# Patient Record
Sex: Male | Born: 1960
Health system: Southern US, Community
[De-identification: ages and names within clinical notes are randomized; demographics above are authoritative.]

## PROBLEM LIST (undated history)

## (undated) ENCOUNTER — Emergency Department (HOSPITAL_COMMUNITY): Discharge status: Absent without leave | Payer: 59

## (undated) DIAGNOSIS — L309 Dermatitis, unspecified: Secondary | ICD-10-CM

## (undated) HISTORY — DX: Dermatitis, unspecified: L30.9

---

## 2007-07-21 ENCOUNTER — Encounter: Admission: RE | Admit: 2007-07-21 | Discharge: 2007-07-21 | Payer: Self-pay | Admitting: Internal Medicine

## 2009-04-17 ENCOUNTER — Encounter: Admission: RE | Admit: 2009-04-17 | Discharge: 2009-04-17 | Payer: Self-pay | Admitting: Occupational Medicine

## 2009-10-10 ENCOUNTER — Emergency Department (HOSPITAL_COMMUNITY): Admission: EM | Admit: 2009-10-10 | Discharge: 2009-10-10 | Payer: Self-pay | Admitting: Emergency Medicine

## 2011-01-10 ENCOUNTER — Encounter: Payer: Self-pay | Admitting: Internal Medicine

## 2011-08-06 ENCOUNTER — Emergency Department (HOSPITAL_COMMUNITY)
Admission: EM | Admit: 2011-08-06 | Discharge: 2011-08-06 | Disposition: A | Discharge status: Home / Self Care | Payer: Worker's Compensation | Attending: Emergency Medicine | Admitting: Emergency Medicine

## 2011-08-06 DIAGNOSIS — W2209XA Striking against other stationary object, initial encounter: Secondary | ICD-10-CM | POA: Insufficient documentation

## 2011-08-06 DIAGNOSIS — Y990 Civilian activity done for income or pay: Secondary | ICD-10-CM | POA: Insufficient documentation

## 2011-08-06 DIAGNOSIS — S61209A Unspecified open wound of unspecified finger without damage to nail, initial encounter: Secondary | ICD-10-CM | POA: Insufficient documentation

## 2012-10-09 ENCOUNTER — Encounter (HOSPITAL_COMMUNITY): Payer: Self-pay

## 2012-10-09 ENCOUNTER — Emergency Department (INDEPENDENT_AMBULATORY_CARE_PROVIDER_SITE_OTHER): Payer: 59

## 2012-10-09 ENCOUNTER — Emergency Department (HOSPITAL_COMMUNITY)
Admission: EM | Admit: 2012-10-09 | Discharge: 2012-10-09 | Disposition: A | Discharge status: Home / Self Care | Payer: 59 | Source: Home / Self Care | Attending: Emergency Medicine | Admitting: Emergency Medicine

## 2012-10-09 DIAGNOSIS — Z23 Encounter for immunization: Secondary | ICD-10-CM

## 2012-10-09 DIAGNOSIS — IMO0002 Reserved for concepts with insufficient information to code with codable children: Secondary | ICD-10-CM

## 2012-10-09 DIAGNOSIS — S92919A Unspecified fracture of unspecified toe(s), initial encounter for closed fracture: Secondary | ICD-10-CM

## 2012-10-09 DIAGNOSIS — S91209A Unspecified open wound of unspecified toe(s) with damage to nail, initial encounter: Secondary | ICD-10-CM

## 2012-10-09 MED ORDER — TETANUS-DIPHTH-ACELL PERTUSSIS 5-2.5-18.5 LF-MCG/0.5 IM SUSP
INTRAMUSCULAR | Status: AC
  Filled 2012-10-09: qty 0.5

## 2012-10-09 MED ORDER — CEPHALEXIN 500 MG PO CAPS: 500.0000 mg | ORAL_CAPSULE | Freq: Three times a day (TID) | ORAL | Status: DC

## 2012-10-09 MED ORDER — TETANUS-DIPHTH-ACELL PERTUSSIS 5-2.5-18.5 LF-MCG/0.5 IM SUSP
0.5000 mL | Freq: Once | INTRAMUSCULAR | Status: AC
  Administered 2012-10-09: 0.5 mL via INTRAMUSCULAR

## 2012-10-09 MED ORDER — LIDOCAINE HCL (PF) 1 % IJ SOLN
INTRAMUSCULAR | Status: AC
  Filled 2012-10-09: qty 5

## 2012-10-09 MED ORDER — TRAMADOL HCL 50 MG PO TABS: 100.0000 mg | ORAL_TABLET | Freq: Three times a day (TID) | ORAL | Status: DC | PRN

## 2012-10-09 MED ORDER — CEFTRIAXONE SODIUM 1 G IJ SOLR
1.0000 g | Freq: Once | INTRAMUSCULAR | Status: AC
  Administered 2012-10-09: 1 g via INTRAMUSCULAR

## 2012-10-09 MED ORDER — CEFTRIAXONE SODIUM 1 G IJ SOLR
INTRAMUSCULAR | Status: AC
  Filled 2012-10-09: qty 10

## 2012-10-09 NOTE — ED Notes (Signed)
Reportedly injured his right great toe 1 week ago while splitting wood; concern for toenail loss and poss infection

## 2012-10-09 NOTE — ED Provider Notes (Signed)
Chief Complaint  Patient presents with  . Toe Injury    History of Present Illness:   Steven Burton is a 51 year old male who injured his right great toe about 2-3 weeks ago. He was splitting some logs and one of them fell on his toe. He had some blood underneath the nail. His wife is an Char[employer] fundraiser and heated paperclip and made a couple small holes in the toenail to release the blood blood. Ever since then he's had increasing pain and swelling over the great toe with some erythema appearing in the last couple days. There's been no purulent drainage.  Review of Systems:  Other than noted above, the patient denies any of the following symptoms: Systemic:  No fevers, chills, sweats, or aches.  No fatigue or tiredness. Musculoskeletal:  No joint pain, arthritis, bursitis, swelling, back pain, or neck pain. Neurological:  No muscular weakness, paresthesias, headache, or trouble with speech or coordination.  No dizziness.  PMFSH:  Past medical history, family history, social history, meds, and allergies were reviewed.  Physical Exam:   Vital signs:  BP 126/81  Pulse 77  Temp 98.4 F (36.9 C) (Oral)  Resp 12  SpO2 97% Gen:  Alert and oriented times 3.  In no distress. Musculoskeletal: The toenail was lifted off of the nail plate and was ballotable. There appears to be fluid or pus underneath the nail. There is erythema of the nail fold surrounding the entire nail and tenderness to palpation. Otherwise, all joints had a full a ROM with no swelling, bruising or deformity.  No edema, pulses full. Extremities were warm and pink.  Capillary refill was brisk.  Skin:  Clear, warm and dry.  No rash. Neuro:  Alert and oriented times 3.  Muscle strength was normal.  Sensation was intact to light touch.     Radiology:  Dg Toe Great Right  10/09/2012  *RADIOLOGY REPORT*  Clinical Data: Great toe penetrating injury by a piece of wood, now red and swollen question infected  RIGHT GREAT TOE  Comparison: None  Findings:  Mild soft tissue swelling right great toe. Longitudinal lucency in the distal phalanx of the great toe extending to the IP joint question nondisplaced fracture. No additional fracture or dislocation. No definite radiopaque foreign body identified. Questionable subungual lucency versus artifact at the great toe.  IMPRESSION: Probable nondisplaced longitudinal fracture at distal phalanx right great toe extending into IP joint. No definite radiopaque foreign body identified. Questionable subungual lucency at the great toe could represent artifact though gas/air and radiolucent foreign body not excluded with this appearance.   Original Report Authenticated By: Lollie Marrow, M.D.      I reviewed the images independently and personally and concur with the radiologist's findings.  Procedure Note:  Verbal informed consent was obtained from the patient.  Risks and benefits were outlined with the patient.  Patient understands and accepts these risks.  Identity of the patient was confirmed verbally and by armband.    Procedure was performed as followed:  The toenail was prepped with alcohol and a digital block was performed with 5 mL of 2% Xylocaine without epinephrine. Thereafter, satisfactory anesthesia was achieved. The nail itself was prepped with Betadine. The nail is very loose and was easily lifted off of the nail bed with a curved hemostat there was minimal bleeding. Antibiotic ointment was applied and a nonstick dressing. The patient was placed in a postoperative boot.  Patient tolerated the procedure well without any immediate complications.  Course in  Urgent Care Center:   He was given Rocephin 1 g IM and tolerated this well without any immediate side effects.  Assessment:  The primary encounter diagnosis was Closed fracture of distal phalanx of toe. Diagnoses of Paronychia and Nail avulsion, toe were also pertinent to this visit.  Plan:   1.  The following meds were prescribed:   New  Prescriptions   CEPHALEXIN (KEFLEX) 500 MG CAPSULE    Take 1 capsule (500 mg total) by mouth 3 (three) times daily.   TRAMADOL (ULTRAM) 50 MG TABLET    Take 2 tablets (100 mg total) by mouth every 8 (eight) hours as needed for pain.   2.  The patient was instructed in symptomatic care, including rest and activity, elevation, application of ice and compression.  Appropriate handouts were given. 3.  The patient was told to return if becoming worse in any way, if no better in 3 or 4 days, and given some red flag symptoms that would indicate earlier return.   4.  The patient was told to follow up with Dr. Ranell Patrick in 3 or 4 days.   [name], MD 10/09/12 1455

## 2012-10-09 NOTE — ED Notes (Signed)
Fitted w approp sized shoe, instructed in use . Advised to call orhto ASAP for f/u exam. Cautioned regarding medication caompliance and safety

## 2012-10-12 LAB — CULTURE, ROUTINE-ABSCESS

## 2012-10-12 NOTE — ED Notes (Signed)
Abscess culture R toe nail:  Abundant Staph. Aureus. Pt. treated with Keflex.  Lab shown to Dr. Lorenz Coaster and he said it was OK- sensitive to Oxacillin. Vassie Moselle 10/12/2012

## 2013-04-02 ENCOUNTER — Emergency Department (HOSPITAL_COMMUNITY)
Admission: EM | Admit: 2013-04-02 | Discharge: 2013-04-03 | Disposition: A | Discharge status: Home / Self Care | Payer: 59 | Attending: Emergency Medicine | Admitting: Emergency Medicine

## 2013-04-02 ENCOUNTER — Encounter (HOSPITAL_COMMUNITY): Payer: Self-pay | Admitting: *Deleted

## 2013-04-02 ENCOUNTER — Encounter (HOSPITAL_COMMUNITY)
Admission: EM | Disposition: A | Discharge status: Home / Self Care | Payer: Self-pay | Source: Home / Self Care | Attending: Emergency Medicine

## 2013-04-02 DIAGNOSIS — K2981 Duodenitis with bleeding: Secondary | ICD-10-CM | POA: Insufficient documentation

## 2013-04-02 DIAGNOSIS — K222 Esophageal obstruction: Secondary | ICD-10-CM

## 2013-04-02 DIAGNOSIS — K2980 Duodenitis without bleeding: Secondary | ICD-10-CM

## 2013-04-02 DIAGNOSIS — K449 Diaphragmatic hernia without obstruction or gangrene: Secondary | ICD-10-CM | POA: Insufficient documentation

## 2013-04-02 DIAGNOSIS — T18108A Unspecified foreign body in esophagus causing other injury, initial encounter: Secondary | ICD-10-CM | POA: Insufficient documentation

## 2013-04-02 DIAGNOSIS — IMO0002 Reserved for concepts with insufficient information to code with codable children: Secondary | ICD-10-CM | POA: Insufficient documentation

## 2013-04-02 DIAGNOSIS — K208 Other esophagitis without bleeding: Secondary | ICD-10-CM

## 2013-04-02 HISTORY — PX: ESOPHAGOGASTRODUODENOSCOPY: SHX5428

## 2013-04-02 SURGERY — EGD (ESOPHAGOGASTRODUODENOSCOPY)
Anesthesia: Moderate Sedation

## 2013-04-02 MED ORDER — DIPHENHYDRAMINE HCL 50 MG/ML IJ SOLN
INTRAMUSCULAR | Status: AC
  Filled 2013-04-02: qty 1

## 2013-04-02 MED ORDER — FENTANYL CITRATE 0.05 MG/ML IJ SOLN
INTRAMUSCULAR | Status: AC
  Filled 2013-04-02: qty 4

## 2013-04-02 MED ORDER — SODIUM CHLORIDE 0.9 % IV SOLN: INTRAVENOUS | Status: DC

## 2013-04-02 MED ORDER — GLUCAGON HCL (RDNA) 1 MG IJ SOLR
1.0000 mg | Freq: Once | INTRAMUSCULAR | Status: AC
  Administered 2013-04-02: 1 mg via INTRAVENOUS
  Filled 2013-04-02: qty 1

## 2013-04-02 MED ORDER — MIDAZOLAM HCL 10 MG/2ML IJ SOLN
INTRAMUSCULAR | Status: AC
  Filled 2013-04-02: qty 2

## 2013-04-02 NOTE — ED Provider Notes (Signed)
History     CSN: [id number]  Arrival date & time 04/02/13  2123   First MD Initiated Contact with Patient 04/02/13 2147      Chief Complaint  Patient presents with  . Foreign Body    HPI Pt was seen at 2155.   Per pt and his spouse, c/o gradual onset and persistence of constant esophageal FB that began approx 3 hours PTA.  Pt states he was eating a slice of apple when he "choked" while swallowing it and "now it's stuck in my throat."  States he cannot swallow water without regurgitating it.  States he has previous hx of same symptoms, but "I can usually get the food to go down eventually."  Pt has not been eval by a GI MD for these symptoms.  Denies wheezing, no stridor, no CP/SOB, no abd pain.     History reviewed. No pertinent past medical history.  History reviewed. No pertinent past surgical history.    History  Substance Use Topics  . Smoking status: Never Smoker   . Smokeless tobacco: Not on file  . Alcohol Use: Yes     Comment: rare      Review of Systems ROS: Statement: All systems negative except as marked or noted in the HPI; Constitutional: Negative for fever and chills. ; ; Eyes: Negative for eye pain, redness and discharge. ; ; ENMT: Negative for ear pain, hoarseness, nasal congestion, sinus pressure and sore throat. ; ; Cardiovascular: Negative for chest pain, palpitations, diaphoresis, dyspnea and peripheral edema. ; ; Respiratory: Negative for cough, wheezing and stridor. ; ; Gastrointestinal: +esophageal FB. Negative for nausea, vomiting, diarrhea, abdominal pain, blood in stool, hematemesis, jaundice and rectal bleeding. . ; ; Genitourinary: Negative for dysuria, flank pain and hematuria. ; ; Musculoskeletal: Negative for back pain and neck pain. Negative for swelling and trauma.; ; Skin: Negative for pruritus, rash, abrasions, blisters, bruising and skin lesion.; ; Neuro: Negative for headache, lightheadedness and neck stiffness. Negative for weakness, altered  level of consciousness , altered mental status, extremity weakness, paresthesias, involuntary movement, seizure and syncope.       Allergies  Review of patient's allergies indicates no known allergies.  Home Medications   Current Outpatient Rx  Name  Route  Sig  Dispense  Refill  . cetirizine (ZYRTEC) 10 MG tablet   Oral   Take 10 mg by mouth daily.         Marland Kitchen ibuprofen (ADVIL,MOTRIN) 200 MG tablet   Oral   Take 600 mg by mouth every 6 (six) hours as needed for pain.           BP 128/73  Pulse 89  Temp(Src) 98.8 F (37.1 C)  Resp 20  SpO2 100%  Physical Exam 2200: Physical examination:  Nursing notes reviewed; Vital signs and O2 SAT reviewed;  Constitutional: Well developed, Well nourished, Well hydrated, In no acute distress; Head:  Normocephalic, atraumatic; Eyes: EOMI, PERRL, No scleral icterus; ENMT: Mouth and pharynx normal, Mucous membranes moist; Neck: Supple, Full range of motion, No lymphadenopathy; Cardiovascular: Regular rate and rhythm, No murmur, rub, or gallop; Respiratory: Breath sounds clear & equal bilaterally, No stridor or wheezing.  Speaking full sentences with ease, Normal respiratory effort/excursion; Chest: Nontender, Movement normal; Abdomen: Soft, Nontender, Nondistended, Normal bowel sounds;; Extremities: Pulses normal, No tenderness, No edema, No calf edema or asymmetry.; Neuro: AA&Ox3, Major CN grossly intact.  Speech clear. Climbs on and off chair by himself without distress. Gait steady. No gross focal  motor or sensory deficits in extremities.; Skin: Color normal, Warm, Dry.   ED Course  Procedures     MDM  MDM Reviewed: previous chart, nursing note and vitals     2215:  Pt spitting oral secretions into trash can next to him.  No wheezing, stridor, or SOB.  Resps without distress, Sats 100% R/A.  T/C to GI Dr. Arlyce Dice, case discussed, including:  HPI, pertinent PM/SHx, VS/PE, dx testing, ED course and treatment:  Agreeable to come to ED for  eval for EGD, requests to give a dose of IV glucacon now.         Laray Anger, DO 04/03/13 [phone]

## 2013-04-02 NOTE — ED Notes (Signed)
Pt states has had piece of apple stuck in throat x 3 hours; spitting; speaking in phrases; no respiratory stridor; family at bedside

## 2013-04-02 NOTE — ED Notes (Signed)
Pt able to talk in full sentences but reports of being unable to swallow.  Pt continually spitting into a cup.

## 2013-04-02 NOTE — H&P (Signed)
  History of Present Illness:  52yo WM with h/o intermittent dysphagia to solids presents to ER with inability to swallow following ingestion of an apple.  Has had intermittent impactions in the past which resolved spontaneously.  Denies chest pain, pyrosis.    History reviewed. No pertinent past medical history. History reviewed. No pertinent past surgical history. family history is not on file. No current facility-administered medications for this encounter.   Allergies as of 04/02/2013  . (No Known Allergies)    reports that he has never smoked. He does not have any smokeless tobacco history on file. He reports that  drinks alcohol. He reports that he does not use illicit drugs.     Review of Systems: Pertinent positive and negative review of systems were noted in the above HPI section. All other review of systems were otherwise negative.  Vital signs were reviewed in today's medical record Physical Exam: General: Well developed , well nourished, no acute distress Skin: anicteric Head: Normocephalic and atraumatic Eyes:  sclerae anicteric, EOMI Ears: Normal auditory acuity Mouth: No deformity or lesions Neck: Supple, no masses or thyromegaly Lungs: Clear throughout to auscultation Heart: Regular rate and rhythm; no murmurs, rubs or bruits Abdomen: Soft, non tender and non distended. No masses, hepatosplenomegaly or hernias noted. Normal Bowel sounds Rectal:deferred Musculoskeletal: Symmetrical with no gross deformities  Skin: No lesions on visible extremities Pulses:  Normal pulses noted Extremities: No clubbing, cyanosis, edema or deformities noted Neurological: Alert oriented x 4, grossly nonfocal Cervical Nodes:  No significant cervical adenopathy Inguinal Nodes: No significant inguinal adenopathy Psychological:  Alert and cooperative. Normal mood and affect  Impression - Solid food impaction, likely secondary to esophageal stricture  Plan - EGD  Risks,  alternatives, and complications of the procedure, including bleeding, perforation, and possible need for surgery, were explained to the patient.  Patient's questions were answered.

## 2013-04-03 ENCOUNTER — Other Ambulatory Visit: Payer: Self-pay | Admitting: Gastroenterology

## 2013-04-03 ENCOUNTER — Encounter (HOSPITAL_COMMUNITY): Payer: Self-pay | Admitting: Gastroenterology

## 2013-04-03 DIAGNOSIS — K2980 Duodenitis without bleeding: Secondary | ICD-10-CM

## 2013-04-03 DIAGNOSIS — K222 Esophageal obstruction: Secondary | ICD-10-CM

## 2013-04-03 DIAGNOSIS — K208 Other esophagitis without bleeding: Secondary | ICD-10-CM

## 2013-04-03 DIAGNOSIS — T18108A Unspecified foreign body in esophagus causing other injury, initial encounter: Secondary | ICD-10-CM

## 2013-04-03 MED ORDER — BUTAMBEN-TETRACAINE-BENZOCAINE 2-2-14 % EX AERO
INHALATION_SPRAY | CUTANEOUS | Status: DC | PRN
  Administered 2013-04-02: 2 via TOPICAL

## 2013-04-03 MED ORDER — FENTANYL CITRATE 0.05 MG/ML IJ SOLN
INTRAMUSCULAR | Status: DC | PRN
  Administered 2013-04-02 – 2013-04-03 (×3): 25 ug via INTRAVENOUS

## 2013-04-03 MED ORDER — OMEPRAZOLE 20 MG PO CPDR: 20.0000 mg | DELAYED_RELEASE_CAPSULE | Freq: Every day | ORAL | Status: AC

## 2013-04-03 MED ORDER — MIDAZOLAM HCL 10 MG/2ML IJ SOLN
INTRAMUSCULAR | Status: DC | PRN
  Administered 2013-04-02 – 2013-04-03 (×2): 2.5 mg via INTRAVENOUS
  Administered 2013-04-03: 2 mg via INTRAVENOUS
  Administered 2013-04-03: 1 mg via INTRAVENOUS

## 2013-04-03 NOTE — Op Note (Addendum)
[facility] ,   ENDOSCOPY PROCEDURE REPORT  PATIENT: Steven Burton, Steven Burton  MR#: [id number] BIRTHDATE: 05/05/1961 , 52  yrs. old GENDER: Male ENDOSCOPIST: Louis Meckel, MD REFERRED BY:  Kirby Funk, M.D. PROCEDURE DATE:  04/02/2013 PROCEDURE:  EGD w/ fb removal and EGD w/ biopsy ASA CLASS:     Class II INDICATIONS:  esophageal foreign body. MEDICATIONS: These medications were titrated to patient response per physician's verbal order, Versed 8 mg IV, and Fentanyl 75 mcg IV TOPICAL ANESTHETIC: Cetacaine Spray  DESCRIPTION OF PROCEDURE: After the risks benefits and alternatives of the procedure were thoroughly explained, informed consent was obtained.  The    endoscope was introduced through the mouth and advanced to the third portion of the duodenum. Without limitations. The instrument was slowly withdrawn as the mucosa was fully examined.        Foreign body was in the mid esophagus.  It was easily pushed through into the stomach. There was a stricture in the distal esophagus.  The 7mm scope easily traversed the stricture.  A 3cm sliding hiatal hernia was present. There were concentric rings in the mid and proximal esophagus. Biopsies were taken.  There was a stricture in the distal esophagus.  The 7mm scope easily traversed the stricture.  A 3cm sliding hiatal hernia was present.  There were concentric rings in the mid and proximal esophagus.  Biopsies were taken.  DUODENUM: Moderate duodenitis with bleeding was noted in the duodenal bulb. Biopsies were taken.the remainder of the esophagus was otherwise normal. Retroflexed views revealed no abnormalities. The scope was then withdrawn from the patient and the procedure completed.  COMPLICATIONS: There were no complications. ENDOSCOPIC IMPRESSION: 1.  Esophageal foreign body 2.  esophageal stricture 3.  esophagitis 4.  duodenitis  RECOMMENDATIONS: 1.  await biopsy results 2.  begin omeprazole 20mg  qd 3.   f/u EGD with esophageal dilitation in 2 weeks REPEAT EXAM:  eSigned:  Louis Meckel, MD 04/03/2013 12:19 AM   CC:  PATIENT NAME:  Steven Burton, Steven Burton MR#: [id number]

## 2013-04-09 ENCOUNTER — Other Ambulatory Visit: Payer: Self-pay | Admitting: Gastroenterology

## 2013-04-09 ENCOUNTER — Telehealth: Payer: Self-pay | Admitting: Gastroenterology

## 2013-04-09 MED ORDER — FLUTICASONE PROPIONATE HFA 220 MCG/ACT IN AERO: INHALATION_SPRAY | RESPIRATORY_TRACT | Status: AC

## 2013-04-09 NOTE — Telephone Encounter (Signed)
Spoke with pt and rx sent to pharmacy. Pt is going to call back regarding OV, see result note.

## 2013-04-10 ENCOUNTER — Telehealth: Payer: Self-pay | Admitting: Gastroenterology

## 2013-04-10 NOTE — Telephone Encounter (Signed)
Spoke with pt yesterday and discussed biopsy results with pt per Dr. Arlyce Dice. Rx sent to the pharmacy and pt aware. Pt did not want to schedule an OV because he has an appt for a colon with Dr. Reece Agar. Pt states he will follow-up with Dr. Laural Benes. Dr. Arlyce Dice aware.

## 2013-04-11 NOTE — Telephone Encounter (Signed)
Sure

## 2013-05-02 ENCOUNTER — Other Ambulatory Visit: Payer: Self-pay | Admitting: Gastroenterology

## 2016-07-29 DIAGNOSIS — M25511 Pain in right shoulder: Secondary | ICD-10-CM | POA: Diagnosis not present

## 2016-08-13 DIAGNOSIS — M25511 Pain in right shoulder: Secondary | ICD-10-CM | POA: Diagnosis not present

## 2016-08-18 DIAGNOSIS — M25511 Pain in right shoulder: Secondary | ICD-10-CM | POA: Diagnosis not present

## 2016-08-24 DIAGNOSIS — Z86018 Personal history of other benign neoplasm: Secondary | ICD-10-CM | POA: Diagnosis not present

## 2016-08-24 DIAGNOSIS — L814 Other melanin hyperpigmentation: Secondary | ICD-10-CM | POA: Diagnosis not present

## 2016-08-24 DIAGNOSIS — Z85828 Personal history of other malignant neoplasm of skin: Secondary | ICD-10-CM | POA: Diagnosis not present

## 2016-08-24 DIAGNOSIS — D225 Melanocytic nevi of trunk: Secondary | ICD-10-CM | POA: Diagnosis not present

## 2016-08-24 DIAGNOSIS — D1800 Hemangioma unspecified site: Secondary | ICD-10-CM | POA: Diagnosis not present

## 2016-08-24 DIAGNOSIS — L821 Other seborrheic keratosis: Secondary | ICD-10-CM | POA: Diagnosis not present

## 2016-08-24 DIAGNOSIS — L820 Inflamed seborrheic keratosis: Secondary | ICD-10-CM | POA: Diagnosis not present

## 2016-08-25 DIAGNOSIS — M25511 Pain in right shoulder: Secondary | ICD-10-CM | POA: Diagnosis not present

## 2016-09-03 DIAGNOSIS — M25511 Pain in right shoulder: Secondary | ICD-10-CM | POA: Diagnosis not present

## 2016-09-08 DIAGNOSIS — M25511 Pain in right shoulder: Secondary | ICD-10-CM | POA: Diagnosis not present

## 2016-09-10 DIAGNOSIS — M25511 Pain in right shoulder: Secondary | ICD-10-CM | POA: Diagnosis not present

## 2016-09-14 DIAGNOSIS — M25511 Pain in right shoulder: Secondary | ICD-10-CM | POA: Diagnosis not present

## 2016-09-17 DIAGNOSIS — Z0000 Encounter for general adult medical examination without abnormal findings: Secondary | ICD-10-CM | POA: Diagnosis not present

## 2016-09-23 DIAGNOSIS — M25511 Pain in right shoulder: Secondary | ICD-10-CM | POA: Diagnosis not present

## 2016-09-28 DIAGNOSIS — M25511 Pain in right shoulder: Secondary | ICD-10-CM | POA: Diagnosis not present

## 2016-10-06 DIAGNOSIS — M25511 Pain in right shoulder: Secondary | ICD-10-CM | POA: Diagnosis not present

## 2016-10-20 DIAGNOSIS — M25511 Pain in right shoulder: Secondary | ICD-10-CM | POA: Diagnosis not present

## 2016-11-03 DIAGNOSIS — M25511 Pain in right shoulder: Secondary | ICD-10-CM | POA: Diagnosis not present

## 2016-11-04 DIAGNOSIS — H524 Presbyopia: Secondary | ICD-10-CM | POA: Diagnosis not present

## 2016-11-07 DIAGNOSIS — M25511 Pain in right shoulder: Secondary | ICD-10-CM | POA: Diagnosis not present

## 2016-11-10 DIAGNOSIS — M25511 Pain in right shoulder: Secondary | ICD-10-CM | POA: Diagnosis not present

## 2016-11-18 DIAGNOSIS — M25511 Pain in right shoulder: Secondary | ICD-10-CM | POA: Diagnosis not present

## 2016-12-29 DIAGNOSIS — M25511 Pain in right shoulder: Secondary | ICD-10-CM | POA: Diagnosis not present

## 2017-01-12 DIAGNOSIS — M25511 Pain in right shoulder: Secondary | ICD-10-CM | POA: Diagnosis not present

## 2017-01-26 DIAGNOSIS — M25511 Pain in right shoulder: Secondary | ICD-10-CM | POA: Diagnosis not present

## 2017-02-09 DIAGNOSIS — M25511 Pain in right shoulder: Secondary | ICD-10-CM | POA: Diagnosis not present

## 2017-03-03 ENCOUNTER — Ambulatory Visit: Payer: Self-pay

## 2017-03-03 ENCOUNTER — Other Ambulatory Visit: Payer: Self-pay | Admitting: Occupational Medicine

## 2017-03-03 DIAGNOSIS — M25532 Pain in left wrist: Secondary | ICD-10-CM

## 2017-03-09 DIAGNOSIS — M25511 Pain in right shoulder: Secondary | ICD-10-CM | POA: Diagnosis not present

## 2017-03-10 MED FILL — MELOXICAM 15 MG TABLET: 15 | 30 days supply | Qty: 30 | Fill #0

## 2017-03-15 DIAGNOSIS — H5340 Unspecified visual field defects: Secondary | ICD-10-CM | POA: Diagnosis not present

## 2017-03-15 DIAGNOSIS — H47011 Ischemic optic neuropathy, right eye: Secondary | ICD-10-CM | POA: Diagnosis not present

## 2017-03-15 DIAGNOSIS — H34231 Retinal artery branch occlusion, right eye: Secondary | ICD-10-CM | POA: Diagnosis not present

## 2017-03-15 DIAGNOSIS — H2513 Age-related nuclear cataract, bilateral: Secondary | ICD-10-CM | POA: Diagnosis not present

## 2017-03-16 DIAGNOSIS — H47011 Ischemic optic neuropathy, right eye: Secondary | ICD-10-CM | POA: Diagnosis not present

## 2017-03-16 DIAGNOSIS — H5340 Unspecified visual field defects: Secondary | ICD-10-CM | POA: Diagnosis not present

## 2017-03-16 DIAGNOSIS — H2513 Age-related nuclear cataract, bilateral: Secondary | ICD-10-CM | POA: Diagnosis not present

## 2017-03-17 DIAGNOSIS — H5340 Unspecified visual field defects: Secondary | ICD-10-CM | POA: Diagnosis not present

## 2017-03-17 DIAGNOSIS — H47011 Ischemic optic neuropathy, right eye: Secondary | ICD-10-CM | POA: Diagnosis not present

## 2017-03-17 MED FILL — OMEPRAZOLE DR 20 MG CAPSULE: 20 | 16 days supply | Qty: 16 | Fill #0

## 2017-03-17 MED FILL — BRIMONIDINE TARTRATE 0.15%: 0.15 | 30 days supply | Qty: 10 | Fill #0

## 2017-03-17 MED FILL — predniSONE 20 MG TABS: 20 | 11 days supply | Qty: 30 | Fill #0

## 2017-03-24 DIAGNOSIS — H5340 Unspecified visual field defects: Secondary | ICD-10-CM | POA: Diagnosis not present

## 2017-03-30 DIAGNOSIS — M25532 Pain in left wrist: Secondary | ICD-10-CM | POA: Diagnosis not present

## 2017-04-05 ENCOUNTER — Emergency Department (HOSPITAL_COMMUNITY): Payer: 59

## 2017-04-05 ENCOUNTER — Emergency Department (HOSPITAL_COMMUNITY)
Admission: EM | Admit: 2017-04-05 | Discharge: 2017-04-06 | Disposition: A | Discharge status: Home / Self Care | Payer: 59 | Attending: Emergency Medicine | Admitting: Emergency Medicine

## 2017-04-05 ENCOUNTER — Encounter (HOSPITAL_COMMUNITY): Payer: Self-pay | Admitting: Emergency Medicine

## 2017-04-05 DIAGNOSIS — H539 Unspecified visual disturbance: Secondary | ICD-10-CM

## 2017-04-05 DIAGNOSIS — H547 Unspecified visual loss: Secondary | ICD-10-CM | POA: Diagnosis present

## 2017-04-05 DIAGNOSIS — H468 Other optic neuritis: Secondary | ICD-10-CM | POA: Diagnosis not present

## 2017-04-05 DIAGNOSIS — H538 Other visual disturbances: Secondary | ICD-10-CM | POA: Insufficient documentation

## 2017-04-05 LAB — SEDIMENTATION RATE: Sed Rate: 7 mm/hr (ref 0–16)

## 2017-04-05 LAB — CBC WITH DIFFERENTIAL/PLATELET
Basophils Absolute: 0 10*3/uL (ref 0.0–0.1)
Basophils Relative: 0 %
EOS PCT: 5 %
Eosinophils Absolute: 0.4 10*3/uL (ref 0.0–0.7)
HEMATOCRIT: 40 % (ref 39.0–52.0)
Hemoglobin: 13.7 g/dL (ref 13.0–17.0)
LYMPHS PCT: 17 %
Lymphs Abs: 1.5 10*3/uL (ref 0.7–4.0)
MCH: 32.8 pg (ref 26.0–34.0)
MCHC: 34.3 g/dL (ref 30.0–36.0)
MCV: 95.7 fL (ref 78.0–100.0)
MONO ABS: 1 10*3/uL (ref 0.1–1.0)
MONOS PCT: 12 %
NEUTROS ABS: 5.6 10*3/uL (ref 1.7–7.7)
Neutrophils Relative %: 66 %
PLATELETS: 150 10*3/uL (ref 150–400)
RBC: 4.18 MIL/uL — ABNORMAL LOW (ref 4.22–5.81)
RDW: 13.4 % (ref 11.5–15.5)
WBC: 8.5 10*3/uL (ref 4.0–10.5)

## 2017-04-05 LAB — BASIC METABOLIC PANEL
ANION GAP: 8 (ref 5–15)
BUN: 14 mg/dL (ref 6–20)
CO2: 24 mmol/L (ref 22–32)
Calcium: 9 mg/dL (ref 8.9–10.3)
Chloride: 107 mmol/L (ref 101–111)
Creatinine, Ser: 0.99 mg/dL (ref 0.61–1.24)
GFR calc Af Amer: >60 mL/min (ref >60)
GFR calc non Af Amer: >60 mL/min (ref >60)
GLUCOSE: 99 mg/dL (ref 65–99)
POTASSIUM: 3.9 mmol/L (ref 3.5–5.1)
Sodium: 139 mmol/L (ref 135–145)

## 2017-04-05 LAB — C-REACTIVE PROTEIN: CRP: 0.8 mg/dL (ref <1.0)

## 2017-04-05 MED ORDER — IOPAMIDOL (ISOVUE-370) INJECTION 76%
INTRAVENOUS | Status: AC
  Administered 2017-04-05: 50 mL
  Filled 2017-04-05: qty 50

## 2017-04-05 NOTE — ED Notes (Signed)
Pt brought back from MRI and placed into D36. Pt connected to pulse ox and BP cuff.

## 2017-04-05 NOTE — ED Triage Notes (Signed)
Pt here to have MRI due to blurry vision and vision loss x 3 weeks in right eye

## 2017-04-05 NOTE — ED Notes (Signed)
Dr. Stark Jock at bedside providing update to patient regarding results of MRI.

## 2017-04-05 NOTE — ED Provider Notes (Signed)
[facility] DEPT Provider Note   CSN: [id number] Arrival date & time: 04/05/17  1858     History   Chief Complaint Chief Complaint  Patient presents with  . Loss of Vision    HPI Steven Burton is a 56 y.o. male.  Patient is a 56 year old male with no significant past medical history. He presents for evaluation of visual changes. He reports "seeing smoke" in the lower visual field of the right eye. This is been ongoing for approximately 3 weeks. He was seen by Dr. Valetta Close from ophthalmology at that time. When followed up today, the patient is now having blurry vision in his left visual field and Dr. Valetta Close feels as though the visual disturbances in the right eye are progressing. He is sending the patient here for evaluation and MRI of the brain to rule out other causes. The patient denies any eye pain. He denies any injury or trauma. He denies any headache, weakness, numbness, or tingling.   The history is provided by the patient.    History reviewed. No pertinent past medical history.  Patient Active Problem List   Diagnosis Date Noted  . Stricture and stenosis of esophagus 04/03/2013  . Duodenitis without mention of hemorrhage 04/03/2013  . Other esophagitis 04/03/2013  . Foreign body in esophagus 04/02/2013    Past Surgical History:  Procedure Laterality Date  . ESOPHAGOGASTRODUODENOSCOPY N/A 04/02/2013   Procedure: ESOPHAGOGASTRODUODENOSCOPY (EGD);  Surgeon: Inda Castle, MD;  Location: [facility];  Service: Endoscopy;  Laterality: N/A;       Home Medications    Prior to Admission medications   Medication Sig Start Date End Date Taking? Authorizing Provider  cetirizine (ZYRTEC) 10 MG tablet Take 10 mg by mouth daily.    Historical Provider, MD  fluticasone (FLOVENT HFA) 220 MCG/ACT inhaler Swallow 2 puffs two times daily 04/09/13   Inda Castle, MD  omeprazole (PRILOSEC) 20 MG capsule Take 1 capsule (20 mg total) by mouth daily. 04/03/13   Inda Castle, MD    Family History History reviewed. No pertinent family history.  Social History Social History  Substance Use Topics  . Smoking status: Never Smoker  . Smokeless tobacco: Not on file  . Alcohol use Yes     Comment: rare     Allergies   Patient has no known allergies.   Review of Systems Review of Systems  All other systems reviewed and are negative.    Physical Exam Updated Vital Signs BP 140/85 (BP Location: Right Arm)   Pulse 75   Temp 98.8 F (37.1 C) (Oral)   Resp 18   SpO2 96%   Physical Exam  Constitutional: He is oriented to person, place, and time. He appears well-developed and well-nourished. No distress.  HENT:  Head: Normocephalic and atraumatic.  Mouth/Throat: Oropharynx is clear and moist.  Eyes: EOM are normal. Pupils are equal, round, and reactive to light.  Funduscopic exam does not reveal any obvious abnormalities.  Neck: Normal range of motion. Neck supple.  Cardiovascular: Normal rate and regular rhythm.  Exam reveals no friction rub.   No murmur heard. Pulmonary/Chest: Effort normal and breath sounds normal. No respiratory distress. He has no wheezes. He has no rales.  Abdominal: Soft. Bowel sounds are normal. He exhibits no distension. There is no tenderness.  Musculoskeletal: Normal range of motion. He exhibits no edema.  Neurological: He is alert and oriented to person, place, and time. No cranial nerve deficit. He exhibits normal muscle tone.  Coordination normal.  Skin: Skin is warm and dry. He is not diaphoretic.  Nursing note and vitals reviewed.    ED Treatments / Results  Labs (all labs ordered are listed, but only abnormal results are displayed) Labs Reviewed  BASIC METABOLIC PANEL  CBC WITH DIFFERENTIAL/PLATELET  SEDIMENTATION RATE  C-REACTIVE PROTEIN    EKG  EKG Interpretation None       Radiology No results found.  Procedures Procedures (including critical care time)  Medications Ordered in  ED Medications - No data to display   Initial Impression / Assessment and Plan / ED Course  I have reviewed the triage vital signs and the nursing notes.  Pertinent labs & imaging results that were available during my care of the patient were reviewed by me and considered in my medical decision making (see chart for details).  Patient with history of recent visual disturbances as per history of present illness presents for MRI at the request of his ophthalmologist. The ophthalmologist had spoken with Dr. Shon Hale from neurology and the plan was for the patient accompanied the ER for this study. The MRI was obtained, however revealed no acute abnormality. The etiology of his visual disturbances are as of yet undetermined.  I discussed the case with Dr. Nicole Kindred from neurology who is recommending CT Levada Dy of the head and neck to rule out any abnormalities that would potentially contribute to an embolic phenomenon.  This study was performed and showed no evidence of flow-limiting stenosis. He will be discharged, to follow up with his ophthalmologist.  Final Clinical Impressions(s) / ED Diagnoses   Final diagnoses:  None    New Prescriptions New Prescriptions   No medications on file     Veryl Speak, MD 04/06/17 1504

## 2017-04-05 NOTE — ED Notes (Signed)
Patient transported to CT 

## 2017-04-05 NOTE — ED Notes (Signed)
Delay in lab draw,  Pt not in room 

## 2017-04-05 NOTE — ED Notes (Signed)
Labs clicked off in error by KM

## 2017-04-06 NOTE — ED Notes (Signed)
Patient left at this time with all belongings. 

## 2017-04-06 NOTE — Discharge Instructions (Signed)
Follow-up with your ophthalmologist tomorrow, and return to the emergency department if symptoms significantly worsen or change.

## 2017-04-07 MED FILL — predniSONE 20 MG TABS: 20 | 30 days supply | Qty: 50 | Fill #0

## 2017-04-08 DIAGNOSIS — H2513 Age-related nuclear cataract, bilateral: Secondary | ICD-10-CM | POA: Diagnosis not present

## 2017-04-08 DIAGNOSIS — H47011 Ischemic optic neuropathy, right eye: Secondary | ICD-10-CM | POA: Diagnosis not present

## 2017-04-08 DIAGNOSIS — H11823 Conjunctivochalasis, bilateral: Secondary | ICD-10-CM | POA: Diagnosis not present

## 2017-04-11 DIAGNOSIS — H47011 Ischemic optic neuropathy, right eye: Secondary | ICD-10-CM | POA: Diagnosis not present

## 2017-04-11 DIAGNOSIS — R400 Somnolence: Secondary | ICD-10-CM | POA: Diagnosis not present

## 2017-04-11 DIAGNOSIS — K200 Eosinophilic esophagitis: Secondary | ICD-10-CM | POA: Diagnosis not present

## 2017-04-12 DIAGNOSIS — H468 Other optic neuritis: Secondary | ICD-10-CM | POA: Diagnosis not present

## 2017-04-20 DIAGNOSIS — Z86010 Personal history of colonic polyps: Secondary | ICD-10-CM | POA: Diagnosis not present

## 2017-04-20 DIAGNOSIS — R1310 Dysphagia, unspecified: Secondary | ICD-10-CM | POA: Diagnosis not present

## 2017-04-20 DIAGNOSIS — K200 Eosinophilic esophagitis: Secondary | ICD-10-CM | POA: Diagnosis not present

## 2017-04-20 MED FILL — PANTOPRAZOLE SOD DR 40 MG T: 40 | 30 days supply | Qty: 60 | Fill #0

## 2017-04-21 DIAGNOSIS — H468 Other optic neuritis: Secondary | ICD-10-CM | POA: Diagnosis not present

## 2017-04-21 MED FILL — BRIMONIDINE TARTRATE 0.15%: 0.15 | 67 days supply | Qty: 10 | Fill #0

## 2017-04-27 ENCOUNTER — Other Ambulatory Visit (HOSPITAL_BASED_OUTPATIENT_CLINIC_OR_DEPARTMENT_OTHER): Payer: Self-pay

## 2017-04-27 DIAGNOSIS — G4710 Hypersomnia, unspecified: Secondary | ICD-10-CM

## 2017-04-27 DIAGNOSIS — G4730 Sleep apnea, unspecified: Secondary | ICD-10-CM

## 2017-04-27 DIAGNOSIS — R0683 Snoring: Secondary | ICD-10-CM

## 2017-05-04 MED FILL — predniSONE 20 MG TABS: 20 | 30 days supply | Qty: 30 | Fill #0

## 2017-05-10 DIAGNOSIS — H468 Other optic neuritis: Secondary | ICD-10-CM | POA: Diagnosis not present

## 2017-05-20 DIAGNOSIS — K219 Gastro-esophageal reflux disease without esophagitis: Secondary | ICD-10-CM | POA: Diagnosis not present

## 2017-05-20 DIAGNOSIS — K200 Eosinophilic esophagitis: Secondary | ICD-10-CM | POA: Diagnosis not present

## 2017-05-20 DIAGNOSIS — K2930 Chronic superficial gastritis without bleeding: Secondary | ICD-10-CM | POA: Diagnosis not present

## 2017-05-20 DIAGNOSIS — R1310 Dysphagia, unspecified: Secondary | ICD-10-CM | POA: Diagnosis not present

## 2017-05-20 DIAGNOSIS — K210 Gastro-esophageal reflux disease with esophagitis: Secondary | ICD-10-CM | POA: Diagnosis not present

## 2017-05-20 DIAGNOSIS — K317 Polyp of stomach and duodenum: Secondary | ICD-10-CM | POA: Diagnosis not present

## 2017-05-20 MED FILL — PANTOPRAZOLE SOD DR 40 MG T: 40 | 90 days supply | Qty: 180 | Fill #0

## 2017-05-23 ENCOUNTER — Ambulatory Visit (HOSPITAL_BASED_OUTPATIENT_CLINIC_OR_DEPARTMENT_OTHER): Payer: 59 | Attending: Internal Medicine | Admitting: Internal Medicine

## 2017-05-23 VITALS — Ht 74.0 in | Wt 235.0 lb

## 2017-05-23 DIAGNOSIS — R0683 Snoring: Secondary | ICD-10-CM | POA: Diagnosis not present

## 2017-05-23 DIAGNOSIS — G4730 Sleep apnea, unspecified: Secondary | ICD-10-CM | POA: Insufficient documentation

## 2017-05-23 DIAGNOSIS — G4733 Obstructive sleep apnea (adult) (pediatric): Secondary | ICD-10-CM | POA: Diagnosis not present

## 2017-05-23 DIAGNOSIS — G4736 Sleep related hypoventilation in conditions classified elsewhere: Secondary | ICD-10-CM | POA: Diagnosis not present

## 2017-05-23 DIAGNOSIS — G4710 Hypersomnia, unspecified: Secondary | ICD-10-CM | POA: Diagnosis not present

## 2017-06-09 DIAGNOSIS — R0683 Snoring: Secondary | ICD-10-CM | POA: Diagnosis not present

## 2017-06-09 NOTE — Procedures (Signed)
   Patient Name: Steven Burton, Steven Burton Date: 05/23/2017 Gender: Male D.O.B: 1961/11/28 Age (years): 56 Referring Provider: Lavone Orn Height (inches): 48 Interpreting Physician: Baird Lyons MD, ABSM Weight (lbs): 235 RPSGT: Jacolyn Reedy BMI: 30 MRN: [id number] Neck Size: 16.00 CLINICAL INFORMATION Sleep Study Type: unattended HST  Indication for sleep study: Excessive Daytime Sleepiness, Snoring, Witnessed Apneas  Epworth Sleepiness Score: 12  SLEEP STUDY TECHNIQUE A multi-channel overnight portable sleep study was performed. The channels recorded were: nasal airflow, thoracic respiratory movement, and oxygen saturation with a pulse oximetry. Snoring was also monitored.  MEDICATIONS Patient self administered medications include: N/A.  SLEEP ARCHITECTURE Patient was studied for 440.5 minutes. The sleep efficiency was 98.4 % and the patient was supine for 43.2%. The arousal index was 0.0 per hour.  RESPIRATORY PARAMETERS The overall AHI was 14.3 per hour, with a central apnea index of 0.3 per hour.  The oxygen nadir was 78% during sleep.  CARDIAC DATA Mean heart rate during sleep was 63.1 bpm.  IMPRESSIONS - Mild obstructive sleep apnea occurred during this study (AHI = 14.3/h). - No significant central sleep apnea occurred during this study (CAI = 0.3/h). - Oxygen desaturation was noted during this study (Min O2 = 78%, Mean 94%). - Patient snored.  DIAGNOSIS - Obstructive Sleep Apnea (327.23 [G47.33 ICD-10]) - Nocturnal Hypoxemia (327.26 [G47.36 ICD-10])  RECOMMENDATIONS - Therapeutic CPAP titration to determine optimal pressure required to alleviate sleep disordered breathing. Other options based on clinical judgment. - Positional therapy avoiding supine position during sleep.. - Be careful with alcohol, sedatives and other CNS depressants that may worsen sleep apnea and disrupt normal sleep architecture. - Sleep hygiene should be reviewed to assess  factors that may improve sleep quality. - Weight management and regular exercise should be initiated or continued.  [Electronically signed] 06/04/2017 09:32 AM  Baird Lyons MD, ABSM Diplomate, American Board of Sleep Medicine   NPI: [id number]  [place], American Board of Sleep Medicine  ELECTRONICALLY SIGNED ON:  06/09/2017, 4:22 PM [place] PH: [phone]   FX: [phone] [place]

## 2017-06-17 DIAGNOSIS — H468 Other optic neuritis: Secondary | ICD-10-CM | POA: Diagnosis not present

## 2017-07-08 DIAGNOSIS — G4733 Obstructive sleep apnea (adult) (pediatric): Secondary | ICD-10-CM | POA: Diagnosis not present

## 2017-08-08 DIAGNOSIS — G4733 Obstructive sleep apnea (adult) (pediatric): Secondary | ICD-10-CM | POA: Diagnosis not present

## 2017-08-25 DIAGNOSIS — L821 Other seborrheic keratosis: Secondary | ICD-10-CM | POA: Diagnosis not present

## 2017-08-25 DIAGNOSIS — D1800 Hemangioma unspecified site: Secondary | ICD-10-CM | POA: Diagnosis not present

## 2017-08-25 DIAGNOSIS — Z86018 Personal history of other benign neoplasm: Secondary | ICD-10-CM | POA: Diagnosis not present

## 2017-08-25 DIAGNOSIS — D225 Melanocytic nevi of trunk: Secondary | ICD-10-CM | POA: Diagnosis not present

## 2017-08-25 DIAGNOSIS — Z85828 Personal history of other malignant neoplasm of skin: Secondary | ICD-10-CM | POA: Diagnosis not present

## 2017-08-25 DIAGNOSIS — L814 Other melanin hyperpigmentation: Secondary | ICD-10-CM | POA: Diagnosis not present

## 2017-09-08 DIAGNOSIS — G4733 Obstructive sleep apnea (adult) (pediatric): Secondary | ICD-10-CM | POA: Diagnosis not present

## 2017-09-20 DIAGNOSIS — Z8739 Personal history of other diseases of the musculoskeletal system and connective tissue: Secondary | ICD-10-CM | POA: Diagnosis not present

## 2017-09-20 DIAGNOSIS — M47816 Spondylosis without myelopathy or radiculopathy, lumbar region: Secondary | ICD-10-CM | POA: Diagnosis not present

## 2017-10-08 DIAGNOSIS — G4733 Obstructive sleep apnea (adult) (pediatric): Secondary | ICD-10-CM | POA: Diagnosis not present

## 2017-11-09 DIAGNOSIS — Z86010 Personal history of colonic polyps: Secondary | ICD-10-CM | POA: Diagnosis not present

## 2017-11-09 DIAGNOSIS — R1310 Dysphagia, unspecified: Secondary | ICD-10-CM | POA: Diagnosis not present

## 2017-11-09 DIAGNOSIS — K200 Eosinophilic esophagitis: Secondary | ICD-10-CM | POA: Diagnosis not present

## 2017-11-09 MED FILL — PANTOPRAZOLE SOD DR 40 MG T: 40 | 90 days supply | Qty: 90 | Fill #0

## 2017-12-06 DIAGNOSIS — H468 Other optic neuritis: Secondary | ICD-10-CM | POA: Diagnosis not present

## 2018-03-07 IMAGING — CT CT ANGIO HEAD
1 of 15 series · 1 of 33 positions shown · IV contrast (isovue)
Comparison: Prior MRI from earlier the same day.

CLINICAL DATA: Initial evaluation for blurry vision and vision loss
for 3 weeks in right eye.

EXAM:
CT ANGIOGRAPHY HEAD AND NECK
TECHNIQUE: Multidetector CT imaging of the head and neck was performed using
the standard protocol during bolus administration of intravenous
contrast. Multiplanar CT image reconstructions and MIPs were
obtained to evaluate the vascular anatomy. Carotid stenosis
measurements (when applicable) are obtained utilizing NASCET
criteria, using the distal internal carotid diameter as the
denominator.
CONTRAST:  50 cc of Isovue 370.

[Series 300: locator · axial · 0.49mm/px · 1 of 1 slices shown]
[im 1/1  soft-tissue]
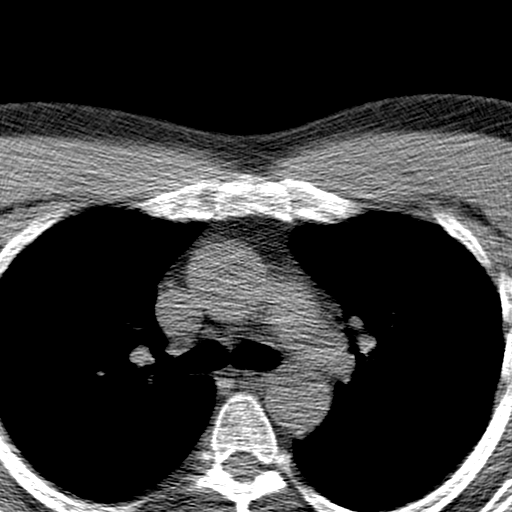

[1 of 33 positions shown; findings below may reference images not displayed]

FINDINGS: CT HEAD FINDINGS

Brain: Cerebral volume within normal limits for age. Probable
minimal chronic microvascular ischemic changes present within the
periventricular white matter.

No acute intracranial hemorrhage. No evidence for acute large vessel
territory infarct. No mass lesion, midline shift or mass effect. No
hydrocephalus. No extra-axial fluid collection.

Vascular: No hyperdense vessel.

Skull: Scalp soft tissues within normal limits.  Calvarium intact.

Sinuses: Visualized paranasal sinuses and mastoid air cells are
clear.

Orbits: Globes and orbital soft tissues within normal limits.

CTA NECK FINDINGS

Aortic arch: Partially visualize aortic arch grossly unremarkable.
Origin of the great vessels not well evaluated on this exam.
Visualized subclavian arteries widely patent.

Right carotid system: Right common and internal carotid artery is
widely patent without stenosis, dissection, or occlusion. No
significant atheromatous narrowing about the right carotid
bifurcation.

Left carotid system: Left comment and internal carotid artery widely
patent without stenosis, dissection, or occlusion. No significant
atheromatous narrowing about the left carotid bifurcation.

Vertebral arteries: Both vertebral arteries arise from the
subclavian arteries. Vertebral arteries widely patent without
stenosis, dissection, or occlusion.

Skeleton: No acute osseous abnormality. No worrisome lytic or
blastic osseous lesions. Mild to moderate degenerative spondylolysis
present at C5-6 and C6-7.

Other neck: Visualized soft tissues of the neck demonstrate no acute
abnormality. No adenopathy. Thyroid normal.

Upper chest: Visualized upper chest demonstrates no acute
abnormality. Partially visualized lung apices are clear.

Review of the MIP images confirms the above findings

CTA HEAD FINDINGS

Anterior circulation: Petrous, cavernous, and supraclinoid segments
of the internal carotid arteries are widely patent without
flow-limiting stenosis. Left A1 segment widely patent. Right A1
segment hypoplastic and diminutive, which likely accounts for the
diminutive right ICA is compared to the left. Anterior communicating
artery normal. Visualized anterior cerebral arteries widely patent.
M1 segments patent without stenosis or occlusion. MCA bifurcations
normal. No proximal M2 occlusion. Visualized distal MCA branches
opacified and symmetric.

Posterior circulation: Vertebral arteries patent to the
vertebrobasilar junction. Left vertebral artery slightly dominant.
Posterior inferior cerebral arteries patent bilaterally. Basilar
artery somewhat diminutive but widely patent. Superior cerebral
arteries patent bilaterally. Left PCA supplied primarily via the
basilar and is widely patent proximally. Fetal type right PCA
supplied via a widely patent right posterior communicating artery.
Visualize right PCA also widely patent.

Venous sinuses: Patent.

Anatomic variants: Fetal type right PCA with slightly diminutive
vertebrobasilar system. Dominant left A1 segment with slightly
diminutive right ICA. No aneurysm or vascular malformation.

Delayed phase: No pathologic enhancement.

Review of the MIP images confirms the above findings
IMPRESSION: Normal CTA of the head and neck. No high-grade or flow-limiting
stenosis. No acute vascular abnormality identified.

## 2018-03-07 IMAGING — MR MR HEAD W/O CM
9 of 10 series · 35 of 48 positions shown · non-contrast
Comparison: None.

CLINICAL DATA: Blurred vision with visual loss in the right eye for
the last 3 weeks.

EXAM:
MRI HEAD WITHOUT CONTRAST
TECHNIQUE: Multiplanar, multiecho pulse sequences of the brain and surrounding
structures were obtained without intravenous contrast.

[Series 3: DWI · axial · 3.0mm · 0.94mm/px · z∈[-73,+72]mm · 8 of 100 slices shown (1 of 2)]
[im 1/100]
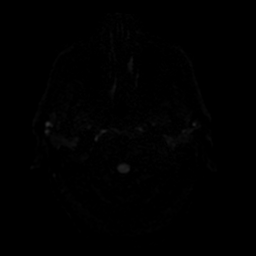
[im 12/100]
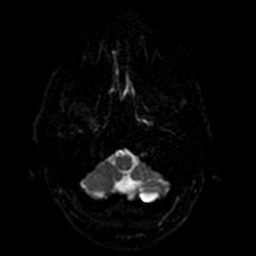
[im 34/100]
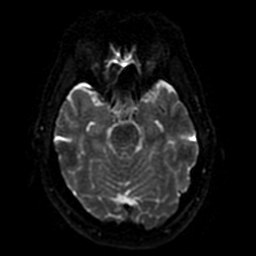
[im 45/100]
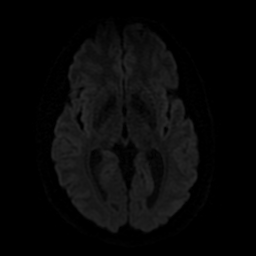
[im 56/100]
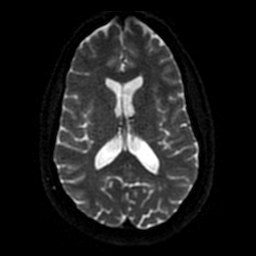
[im 67/100]
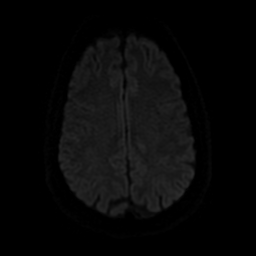
[im 89/100]
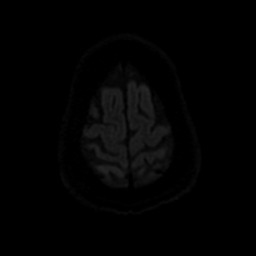
[im 100/100]
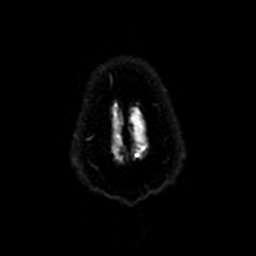

[Series 4: DWI · coronal · 4.0mm · 0.94mm/px · 7 of 74 slices shown (2 of 2)]
[im 1/74]
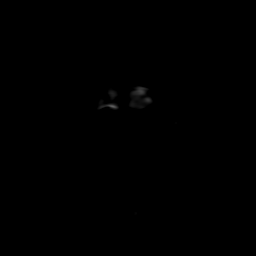
[im 13/74]
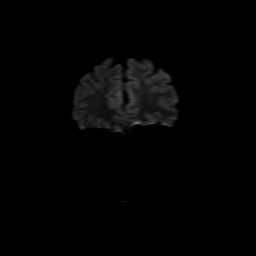
[im 25/74]
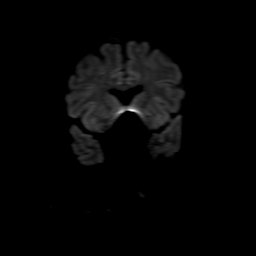
[im 37/74]
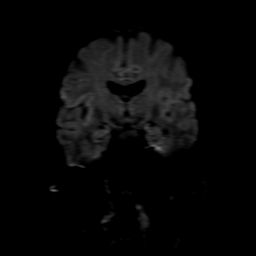
[im 49/74]
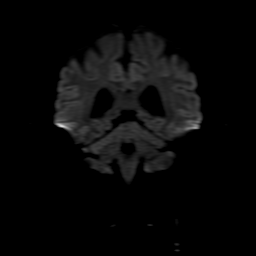
[im 61/74]
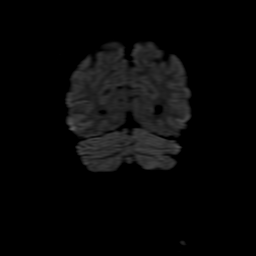
[im 74/74]
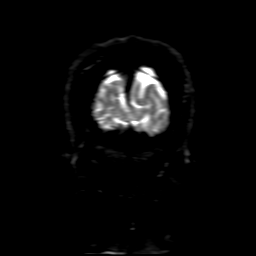

[Series 5: FLAIR · sagittal · 5.0mm · 0.47mm/px · 2 of 23 slices shown (1 of 2)]
[im 1/23]
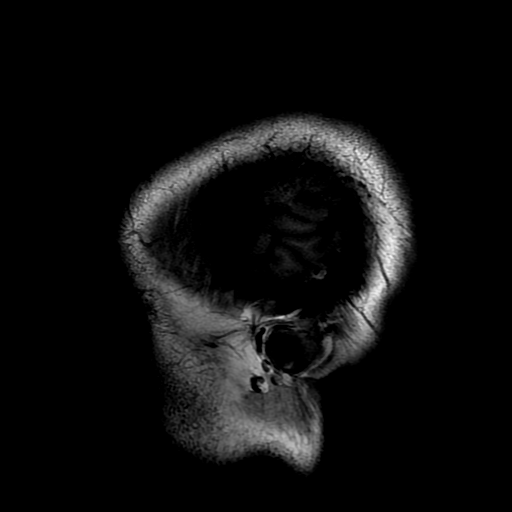
[im 23/23]
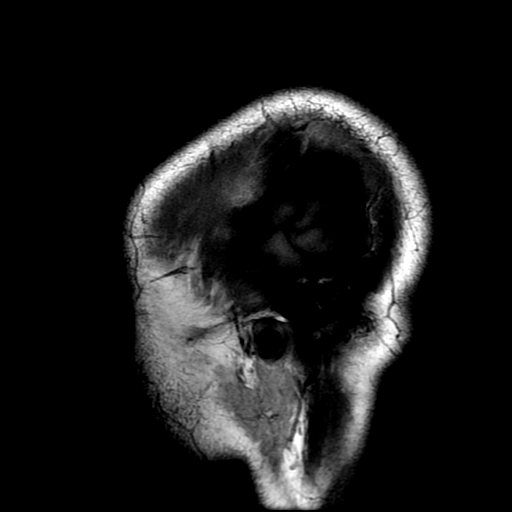

[Series 6: T2 · axial · 5.0mm · 0.47mm/px · z∈[-72,+70]mm · 2 of 25 slices shown (1 of 2)]
[im 1/25]
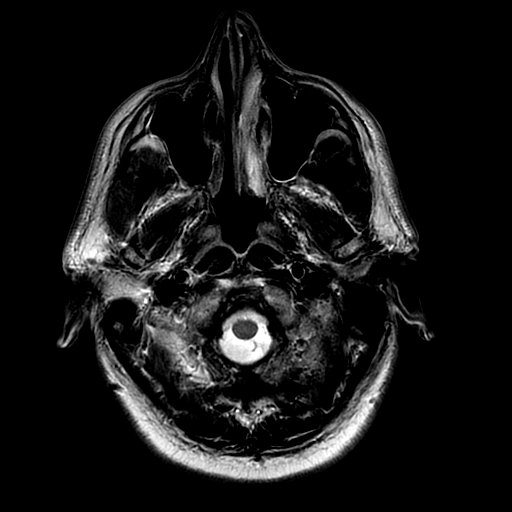
[im 25/25]
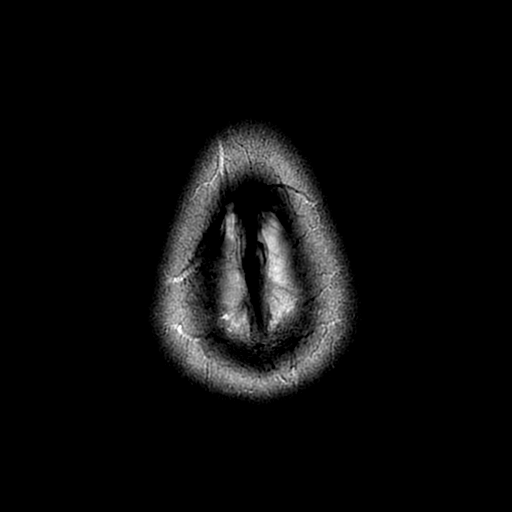

[Series 7: FLAIR · axial · 5.0mm · 0.47mm/px · z∈[-72,+70]mm · 2 of 25 slices shown (2 of 2)]
[im 1/25]
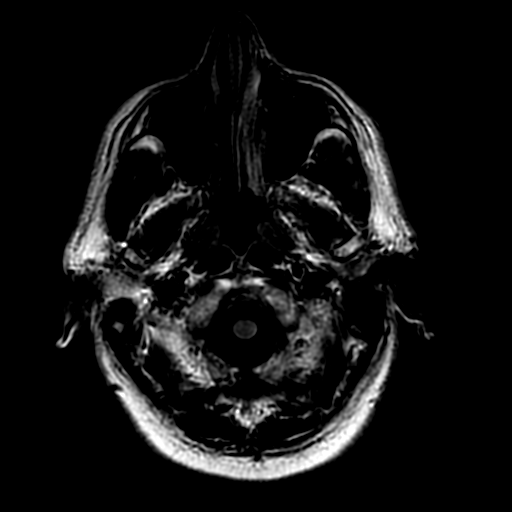
[im 25/25]
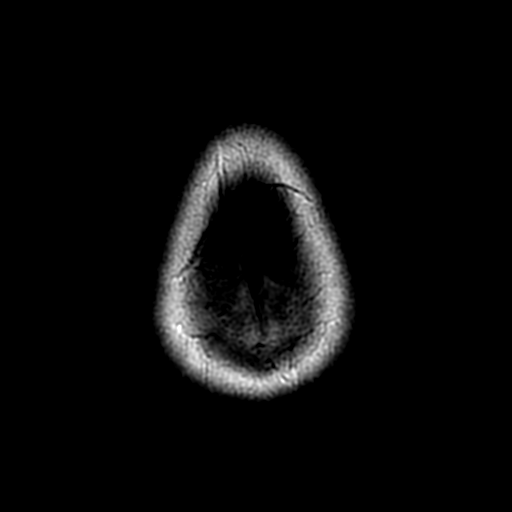

[Series 8: (person_name) · axial · 3.0mm · 0.47mm/px · z∈[-73,-38]mm · 3 of 100 slices shown]
[im 1/100]
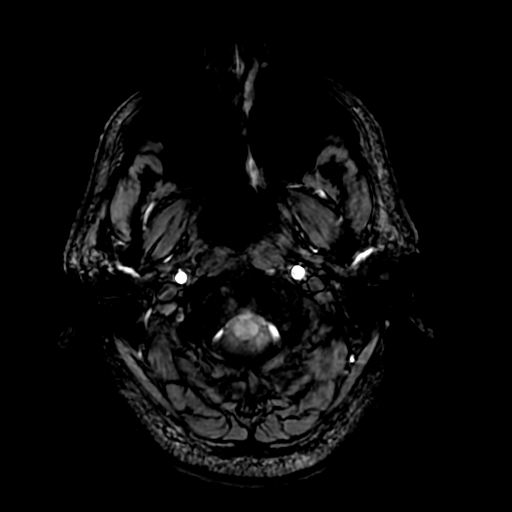
[im 13/100]
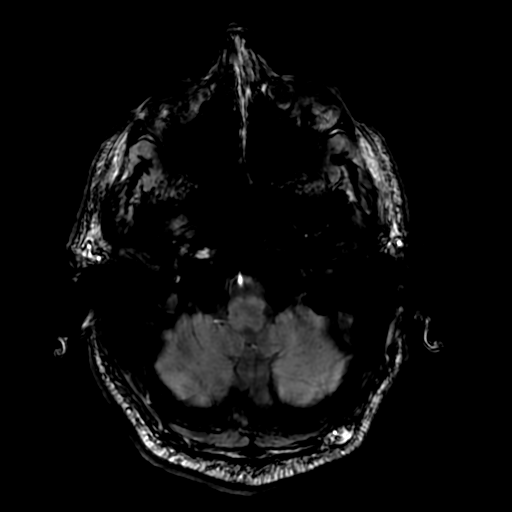
[im 25/100]
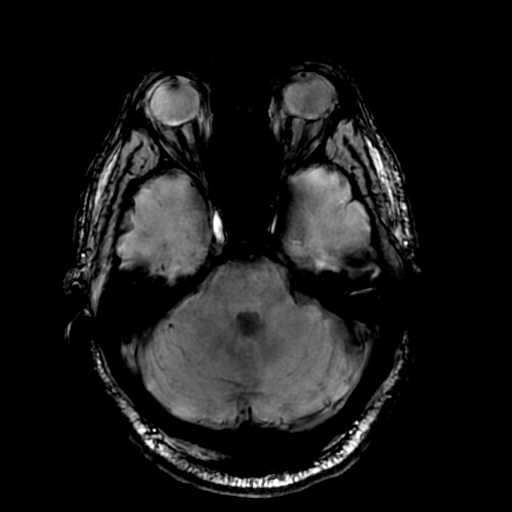

[Series 10: T2 · coronal · 5.0mm · 0.39mm/px · 3 of 31 slices shown (2 of 2)]
[im 1/31]
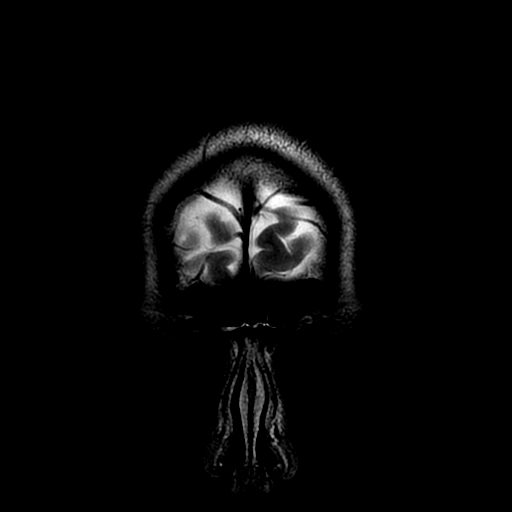
[im 16/31]
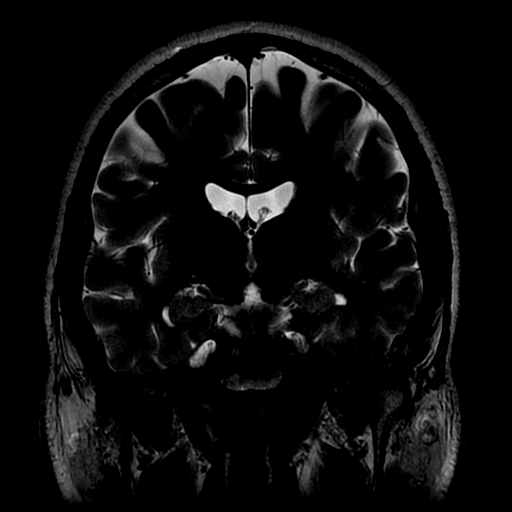
[im 31/31]
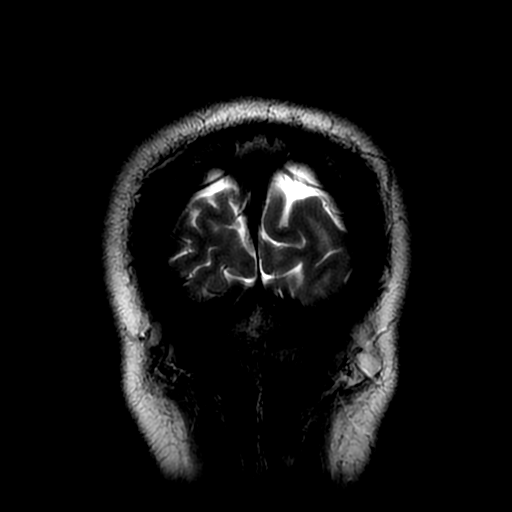

[Series 350: ADC · axial · 3.0mm · 0.94mm/px · z∈[-73,+72]mm · 5 of 48 slices shown (1 of 2)]
[im 1/48]
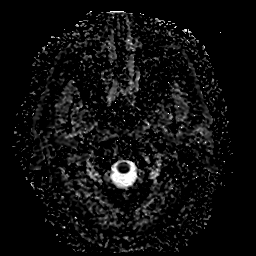
[im 12/48]
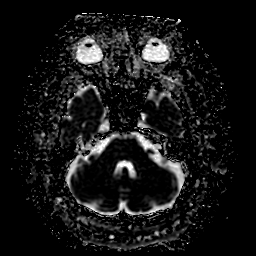
[im 24/48]
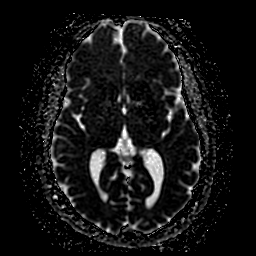
[im 36/48]
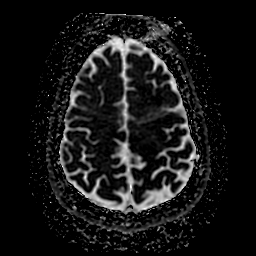
[im 48/48]
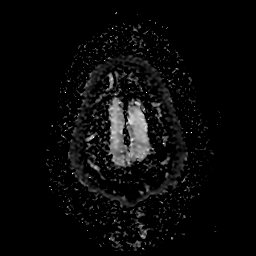

[Series 450: ADC · coronal · 4.0mm · 0.94mm/px · 3 of 37 slices shown (2 of 2)]
[im 1/37]
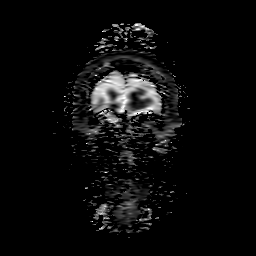
[im 19/37]
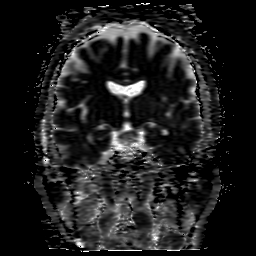
[im 37/37]
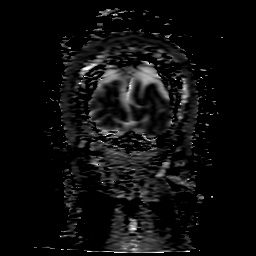

[35 of 48 positions shown; findings below may reference images not displayed]

FINDINGS: Brain: Diffusion imaging does not show any acute or subacute
infarction. The brainstem and cerebellum are normal. Cerebral
hemispheres show scattered punctate foci of T2 and FLAIR signal
primarily within the subcortical white matter, frontal lobe
predominant. No cortical or large vessel territory insult. No
occipital region insult or visible abnormality along the optic
radiations. No mass lesion, hemorrhage, hydrocephalus or extra-axial
collection.

Vascular: Major vessels at the base of the brain show flow.

Skull and upper cervical spine: Negative

Sinuses/Orbits: No significant sinus disease. Both globes appear
normal. Both optic nerves appear normal as seen. No other orbital,
orbital apex or cavernous sinus region abnormality.

Other: None significant
IMPRESSION: No specific lesion seen to explain the presenting symptoms. Normal
study with exception of a few scattered foci of T2 and FLAIR signal
in the subcortical white matter, frontal lobe predominant. This is
fairly commonly seen in persons of this age an usually represents a
subclinical early manifestation of small vessel change.

## 2023-08-04 ENCOUNTER — Other Ambulatory Visit: Payer: Self-pay | Admitting: Urology

## 2023-08-04 DIAGNOSIS — R972 Elevated prostate specific antigen [PSA]: Secondary | ICD-10-CM

## 2023-09-16 ENCOUNTER — Ambulatory Visit
Admission: RE | Admit: 2023-09-16 | Discharge: 2023-09-16 | Disposition: A | Discharge status: Home / Self Care | Payer: BC Managed Care – PPO | Source: Ambulatory Visit | Attending: Urology | Admitting: Urology

## 2023-09-16 DIAGNOSIS — R972 Elevated prostate specific antigen [PSA]: Secondary | ICD-10-CM

## 2023-09-16 MED ORDER — GADOPICLENOL 0.5 MMOL/ML IV SOLN
10.0000 mL | Freq: Once | INTRAVENOUS | Status: AC | PRN
  Administered 2023-09-16: 10 mL via INTRAVENOUS

## 2024-01-09 ENCOUNTER — Emergency Department (HOSPITAL_COMMUNITY): Payer: Worker's Compensation

## 2024-01-09 ENCOUNTER — Other Ambulatory Visit: Payer: Self-pay

## 2024-01-09 ENCOUNTER — Emergency Department (HOSPITAL_COMMUNITY)
Admission: EM | Admit: 2024-01-09 | Discharge: 2024-01-09 | Disposition: A | Discharge status: Home / Self Care | Payer: Worker's Compensation | Attending: Emergency Medicine | Admitting: Emergency Medicine

## 2024-01-09 DIAGNOSIS — Y9301 Activity, walking, marching and hiking: Secondary | ICD-10-CM | POA: Diagnosis not present

## 2024-01-09 DIAGNOSIS — S8391XA Sprain of unspecified site of right knee, initial encounter: Secondary | ICD-10-CM | POA: Diagnosis not present

## 2024-01-09 DIAGNOSIS — M25562 Pain in left knee: Secondary | ICD-10-CM | POA: Diagnosis present

## 2024-01-09 DIAGNOSIS — W010XXA Fall on same level from slipping, tripping and stumbling without subsequent striking against object, initial encounter: Secondary | ICD-10-CM | POA: Insufficient documentation

## 2024-01-09 MED ORDER — MELOXICAM 7.5 MG PO TABS: 7.5000 mg | ORAL_TABLET | Freq: Every day | ORAL | 0 refills | Status: AC

## 2024-01-09 NOTE — ED Triage Notes (Signed)
Patient to ED by POV for [employer]. He states on 1/10 he fell and twisted L ankle. He c/o pain and swelling.

## 2024-01-09 NOTE — Discharge Instructions (Addendum)
Rest your leg and elevate when you can.  You will take the meloxicam once a day to help with inflammation.  Wear the brace anytime you are up walking around.  It will be important to follow-up with orthopedics for repeat evaluation

## 2024-01-09 NOTE — ED Provider Notes (Signed)
[facility] AT [facility] Provider Note   CSN: [id number] Arrival date & time: 01/09/24  1051     History  Chief Complaint  Patient presents with   Steven Burton is a 63 y.o. male.  Patient is a 63 year old male with no significant medical problems other than GERD who is presenting today with complaints of gradually worsening left knee pain.  He reports 10 days ago while he was at work he does a job where he walks about 6 to 7 miles every evening and he was walking and slipped on the ice causing him to fall forward and jammed his knee while it was completely extended.  He did not fall to the ground but felt a little twinge.  He did not think much of it and continued his job but over the last 10 days it has been gradually having more pain and is now developed stiffness and swelling.  He told his employer today and felt that it would be Worker's Comp. and they told him he had to come to the emergency room.  He denies any pain anywhere else.  No numbness or tingling distal to the area that is bothering him.  No fever, erythema to the area.  No prior issues with his knee in the past.  The history is provided by the patient.  Fall       Home Medications Prior to Admission medications   Medication Sig Start Date End Date Taking? Authorizing Provider  meloxicam (MOBIC) 7.5 MG tablet Take 1 tablet (7.5 mg total) by mouth daily. 01/09/24  Yes Kealy Lewter, Alphonzo Lemmings, MD  cetirizine (ZYRTEC) 10 MG tablet Take 10 mg by mouth daily.    [provider]  fluticasone (FLOVENT HFA) 220 MCG/ACT inhaler Swallow 2 puffs two times daily 04/09/13   Louis Meckel, MD  omeprazole (PRILOSEC) 20 MG capsule Take 1 capsule (20 mg total) by mouth daily. 04/03/13   Louis Meckel, MD      Allergies    Patient has no known allergies.    Review of Systems   Review of Systems  Physical Exam Updated Vital Signs BP 134/88 (BP Location: Right Arm)   Pulse 74    Temp 98.4 F (36.9 C) (Oral)   Resp 16   Ht 6\' 2"  (1.88 m)   Wt 98.9 kg   SpO2 100%   BMI 27.99 kg/m  Physical Exam Vitals and nursing note reviewed.  Constitutional:      General: He is not in acute distress.    Appearance: He is well-developed.  HENT:     Head: Normocephalic and atraumatic.  Eyes:     Conjunctiva/sclera: Conjunctivae normal.     Pupils: Pupils are equal, round, and reactive to light.  Cardiovascular:     Rate and Rhythm: Normal rate and regular rhythm.     Pulses: Normal pulses.     Heart sounds: No murmur heard. Pulmonary:     Effort: Pulmonary effort is normal.  Musculoskeletal:        General: Tenderness present. Normal range of motion.     Cervical back: Normal range of motion and neck supple.     Left knee: Effusion present. Normal range of motion. Tenderness present over the medial joint line and lateral joint line. No patellar tendon tenderness. No LCL laxity, MCL laxity or ACL laxity.Normal pulse.     Comments: With lateral compression to the knee he does have medial pain.  Mild effusion noted on exam but able to flex to over 90 degrees.  No erythema or warmth noted.  Skin:    General: Skin is warm and dry.     Findings: No erythema or rash.  Neurological:     Mental Status: He is alert and oriented to person, place, and time.  Psychiatric:        Behavior: Behavior normal.     ED Results / Procedures / Treatments   Labs (all labs ordered are listed, but only abnormal results are displayed) Labs Reviewed - No data to display  EKG None  Radiology DG Knee Complete 4 Views Left Result Date: 01/09/2024 CLINICAL DATA:  Status post fall with injury to the left knee EXAM: LEFT KNEE - COMPLETE 4 VIEW COMPARISON:  None Available. FINDINGS: There are no findings of fracture or dislocation. Small joint effusion. Well corticated ossific density projecting along the lateral tibial plateau. Soft tissues are unremarkable. IMPRESSION: 1. No acute  fracture or dislocation. 2. Small joint effusion. 3. Well corticated ossific density projecting along the lateral tibial plateau may reflect sequela of prior injury. Electronically Signed   By: Agustin Cree M.D.   On: 01/09/2024 13:58    Procedures Procedures    Medications Ordered in ED Medications - No data to display  ED Course/ Medical Decision Making/ A&P                                 Medical Decision Making Amount and/or Complexity of Data Reviewed Radiology: ordered and independent interpretation performed. Decision-making details documented in ED Course.  Risk Prescription drug management.   Patient with an injury 10 days ago with gradually worsening symptoms.  Concern for possible meniscal or ligamentous injury.  Lower suspicion for fracture but will get a plain x-ray to rule that out.  Patient has no notable ligamental laxity and denies feeling like his leg will give out.  2:39 PM I have independently visualized and interpreted pt's images today.  X-rays do not show any sign of fracture today.  Radiology does report a small joint effusion and a well-corticated ossific density projecting along the lateral tibial plateau which may reflect prior injury.  Patient was placed in a knee brace, given meloxicam and to follow-up with orthopedics.  He was also taken out of work for 1 week to [employer] amount of walking he is doing.  He will elevate and ice as well.         Final Clinical Impression(s) / ED Diagnoses Final diagnoses:  Sprain of right knee, unspecified ligament, initial encounter    Rx / DC Orders ED Discharge Orders          Ordered    meloxicam (MOBIC) 7.5 MG tablet  Daily        01/09/24 1338              Gwyneth Sprout, MD 01/09/24 1440

## 2024-01-09 NOTE — Progress Notes (Signed)
Orthopedic Tech Progress Note Patient Details:  Steven Burton 1961-11-04 [id number]  Ortho Devices Type of Ortho Device: Knee Sleeve Ortho Device/Splint Location: LLE Ortho Device/Splint Interventions: Application, Ordered, Adjustment   Post Interventions Patient Tolerated: Well Instructions Provided: Care of device, Adjustment of device  [place] A Damion Kant 01/09/2024, 2:04 PM

## 2024-07-02 ENCOUNTER — Other Ambulatory Visit: Payer: Self-pay

## 2024-07-02 ENCOUNTER — Encounter: Payer: Self-pay | Admitting: Internal Medicine

## 2024-07-02 ENCOUNTER — Ambulatory Visit (INDEPENDENT_AMBULATORY_CARE_PROVIDER_SITE_OTHER): Payer: Self-pay | Admitting: Internal Medicine

## 2024-07-02 VITALS — BP 120/78 | HR 67 | Temp 98.0°F | Resp 16 | Ht 72.5 in | Wt 224.8 lb

## 2024-07-02 DIAGNOSIS — L308 Other specified dermatitis: Secondary | ICD-10-CM

## 2024-07-02 DIAGNOSIS — R221 Localized swelling, mass and lump, neck: Secondary | ICD-10-CM | POA: Diagnosis not present

## 2024-07-02 DIAGNOSIS — J310 Chronic rhinitis: Secondary | ICD-10-CM | POA: Diagnosis not present

## 2024-07-02 DIAGNOSIS — K200 Eosinophilic esophagitis: Secondary | ICD-10-CM | POA: Insufficient documentation

## 2024-07-02 DIAGNOSIS — L309 Dermatitis, unspecified: Secondary | ICD-10-CM | POA: Insufficient documentation

## 2024-07-02 MED ORDER — OMEPRAZOLE MAGNESIUM 20 MG PO TBEC: 20.0000 mg | DELAYED_RELEASE_TABLET | Freq: Every day | ORAL | 5 refills | Status: AC

## 2024-07-02 MED ORDER — AZELASTINE HCL 0.1 % NA SOLN: 2.0000 | Freq: Two times a day (BID) | NASAL | 5 refills | Status: AC | PRN

## 2024-07-02 MED ORDER — FEXOFENADINE HCL 180 MG PO TABS: 180.0000 mg | ORAL_TABLET | Freq: Every day | ORAL | 5 refills | Status: AC | PRN

## 2024-07-02 NOTE — Patient Instructions (Addendum)
 Eosinophilic esophagitis Eosinophilic esophagitis controlled with omeprazole . Symptoms recur upon discontinuation. History of food impaction with large bites. Under gastroenterologist care, endoscopy pending. Potential food triggers unconfirmed. Dupixent considered if symptoms persist. - Increase omeprazole  20 mg to daily dosing. - Coordinate with gastroenterologist for endoscopy. - Consider food allergy testing during allergy testing appointment. - Discuss Dupixent use if symptoms persist or worsen.  Allergic rhinitis Long-standing allergic rhinitis worsened last spring. Symptoms include sneezing and nasal congestion, particularly at night. Switched to Allegra  with some relief. Nonallergic rhinitis unlikely due to seasonal pattern. - Continue Allegra  180 mg daily as needed during allergy season. - Prescribe Astepro  nasal spray for congestion as needed. 2 sprays in each nostril twice daily as needed.  - Schedule allergy testing on a Monday with Dr. Marinda.  Kiwi fruit allergy Throat swelling and itching upon kiwi consumption suggest allergic reaction-possible type 1 reaction, possible oral allergy syndrome, possible contact dermatits. Avoids kiwi. Cross-reactivity with banana noted but tolerates banana well. - Avoid kiwi fruit. - Test for banana allergy during allergy testing appointment.  Eczema Dry skin patches previously itchy, currently not bothersome. Under dermatologist care for annual checks. - Continue annual dermatology follow-ups.  Recording duration: 18 minutes-time spent in consultation with patient. Does not count precharting, and documentation following encounter including finishing notes, coding, putting in prescriptions, verifying patient information which accounted for another 40 minutes    Follow up : for allergy testing (1-68, beef, oat, corn, chicken, potato). Must stop antihistamines 3 days prior to visit It was a pleasure meeting you in clinic today! Thank you for  allowing me to participate in your care.

## 2024-07-02 NOTE — Progress Notes (Signed)
 NEW PATIENT Date of Service/Encounter:   07/02/2024 Referring provider: Charlott Dorn LABOR, * Primary care provider: Signa Rush, MD (Inactive)  Subjective:  Steven Burton is a 63 y.o. male with a PMHx of OSA on CPAP, EoE managed with PPI presenting today for evaluation of chronic rhinitis.  History obtained from: chart review and patient.   Discussed the use of AI scribe software for clinical note transcription with the patient, who gave verbal consent to proceed.  History of Present Illness   The patient, with eosinophilic esophagitis and seasonal allergies, presents with worsening seasonal allergies and eosinophilic esophagitis.  Allergic rhinoconjunctivitis - Worsening seasonal allergy symptoms, particularly in the spring - Longstanding history of allergies since his thirties - Symptoms include sneezing, nasal congestion, and difficulty breathing through the nose, especially at night - Symptoms impact sleep quality - Previously managed symptoms with half-dose Zyrtec for ten years to avoid drowsiness; effective until this past spring - Switched to Allegra  at the end of the last allergy season, with partial relief; uncertain about future efficacy - No use of decongestants - Nasal sprays have not been effective in the past - No current nasal symptoms outside of allergy season  Eosinophilic esophagitis - Managed with omeprazole  three to four times per week - Believes discontinuation of omeprazole  would result in symptom recurrence - History of food impaction, notably with apple, requiring emergency intervention - Avoids large bites and certain foods (beef, cornflakes) due to dysphagia - No recollection of using Flovent  for EOE, may have tried once  Atopic dermatitis - History of eczema managed with annual dermatology visits - Current symptoms include dry patches without significant pruritus - Family history of skin issues on maternal side  Oral allergy syndrome and  food allergies - Allergy to kiwi causing throat swelling; strict avoidance - Mild oral symptoms with bananas if eaten quickly, otherwise tolerated - No other food allergies - No history of asthma - No shortness of breath or chest tightness       Chart Review:  Reviewed PCP notes from referral 04/02/24: Chronic rhinitis, seasonal.  Allergy referral requested.  OSA on CPAP.  History of EOE controlled on PPI.  Repeat biopsy on PPI negative.  Past Medical History: Past Medical History:  Diagnosis Date   Eczema    Medication List:  Current Outpatient Medications  Medication Sig Dispense Refill   aspirin EC 81 MG tablet Take 81 mg by mouth daily.     azelastine  (ASTELIN ) 0.1 % nasal spray Place 2 sprays into both nostrils 2 (two) times daily as needed for rhinitis. Use in each nostril as directed 30 mL 5   cetirizine (ZYRTEC) 10 MG tablet Take 10 mg by mouth daily.     fexofenadine  (ALLEGRA  ALLERGY) 180 MG tablet Take 1 tablet (180 mg total) by mouth daily as needed for allergies or rhinitis. 32 tablet 5   Omega-3 Fatty Acids (FISH OIL) 1000 MG CAPS 2 capsules Orally Once a day     omeprazole  (PRILOSEC  OTC) 20 MG tablet Take 1 tablet (20 mg total) by mouth daily. 32 tablet 5   omeprazole  (PRILOSEC ) 20 MG capsule Take 1 capsule (20 mg total) by mouth daily. 30 capsule 3   fluticasone  (FLOVENT  HFA) 220 MCG/ACT inhaler Swallow 2 puffs two times daily (Patient not taking: Reported on 07/02/2024) 1 Inhaler 12   meloxicam  (MOBIC ) 7.5 MG tablet Take 1 tablet (7.5 mg total) by mouth daily. (Patient not taking: Reported on 07/02/2024) 15 tablet 0   No  current facility-administered medications for this visit.   Known Allergies:  No Known Allergies Past Surgical History: Past Surgical History:  Procedure Laterality Date   ESOPHAGOGASTRODUODENOSCOPY N/A 04/02/2013   Procedure: ESOPHAGOGASTRODUODENOSCOPY (EGD);  Surgeon: Lamar JONETTA Aho, MD;  Location: [facility];  Service: Endoscopy;  Laterality:  N/A;   Family History: Family History  Problem Relation Age of Onset   Eczema Mother    Social History: [name] lives at home built 33 years ago, hardwood floors, heat pump heating and gas, 2 indoor cats, no roaches, not using dust mite covers on the bed of the pillows, no smoke exposure.  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   ROS:  All other systems negative except as noted per HPI.  Objective:  Blood pressure 120/78, pulse 67, temperature 98 F (36.7 C), temperature source Temporal, resp. rate 16, height 6' 0.5 (1.842 m), weight 224 lb 12.8 oz (102 kg), SpO2 95%. Body mass index is 30.07 kg/m. Physical Exam:  General Appearance:  Alert, cooperative, no distress, appears stated age  Head:  Normocephalic, without obvious abnormality, atraumatic  Eyes:  Conjunctiva clear, EOM's intact  Ears EACs normal bilaterally and normal TMs bilaterally  Nose: Nares normal, hypertrophic turbinates, normal mucosa, and no visible anterior polyps  Throat: Lips, tongue normal; teeth and gums normal, normal posterior oropharynx  Neck: Supple, symmetrical  Lungs:   clear to auscultation bilaterally, Respirations unlabored, no coughing  Heart:  regular rate and rhythm and no murmur, Appears well perfused  Extremities: No edema  Skin: erythematous, dry patches scattered on bilateral forearms  Neurologic: No gross deficits   Diagnostics:  Labs:  Lab Orders  No laboratory test(s) ordered today     Assessment and Plan  Assessment and Plan    Eosinophilic esophagitis Eosinophilic esophagitis controlled with omeprazole . Symptoms recur upon discontinuation. History of food impaction with large bites. Under gastroenterologist care, endoscopy pending. Potential food triggers unconfirmed. Dupixent considered if symptoms persist. - Increase omeprazole  20 mg to daily dosing. - Coordinate with gastroenterologist for endoscopy. -  Consider food allergy testing during allergy testing appointment. - Discuss Dupixent use if symptoms persist or worsen.  Allergic rhinitis Long-standing allergic rhinitis worsened last spring. Symptoms include sneezing and nasal congestion, particularly at night. Switched to Allegra  with some relief. Nonallergic rhinitis unlikely due to seasonal pattern. - Continue Allegra  180 mg daily as needed during allergy season. - Prescribe Astepro  nasal spray for congestion as needed. 2 sprays in each nostril twice daily as needed.  - Schedule allergy testing on a Monday with Dr. Marinda.  Kiwi fruit allergy Throat swelling and itching upon kiwi consumption suggest allergic reaction-possible type 1 reaction, possible oral allergy syndrome, possible contact dermatits. Avoids kiwi. Cross-reactivity with banana noted but tolerates banana well. - Avoid kiwi fruit. - Test for banana allergy during allergy testing appointment.  Eczema Dry skin patches previously itchy, currently not bothersome. Under dermatologist care for annual checks. - Continue annual dermatology follow-ups.  Recording duration: 18 minutes-time spent in consultation with patient. Does not count precharting, and documentation following encounter including finishing notes, coding, putting in prescriptions, verifying patient information which accounted for another 40 minutes    Follow up : for allergy testing (1-68, beef, oat, corn, chicken, potato). Must stop antihistamines 3 days prior to visit It was a pleasure meeting you in clinic today! Thank you for allowing me to participate in your care.  Rocky Marinda, MD [facility]   This note in its entirety was forwarded to the Provider who requested this consultation.  Other: none  Thank you for your kind referral. I appreciate the opportunity to take part in Adryan's care. Please do not hesitate to contact me with questions.  Sincerely,  Rocky Endow, MD [facility]
